# Patient Record
Sex: Female | Born: 1955 | Race: White | Hispanic: No | Marital: Married | Smoking: Former smoker
Health system: Southern US, Community
[De-identification: ages and names within clinical notes are randomized; demographics above are authoritative.]

## PROBLEM LIST (undated history)

## (undated) DIAGNOSIS — R011 Cardiac murmur, unspecified: Secondary | ICD-10-CM

## (undated) DIAGNOSIS — M1990 Unspecified osteoarthritis, unspecified site: Secondary | ICD-10-CM

## (undated) HISTORY — DX: Unspecified osteoarthritis, unspecified site: M19.90

## (undated) HISTORY — PX: TUMOR REMOVAL: SHX12

## (undated) HISTORY — DX: Cardiac murmur, unspecified: R01.1

---

## 1999-06-08 ENCOUNTER — Other Ambulatory Visit: Admission: RE | Admit: 1999-06-08 | Discharge: 1999-06-08 | Payer: Self-pay | Admitting: Obstetrics and Gynecology

## 2000-08-08 ENCOUNTER — Other Ambulatory Visit: Admission: RE | Admit: 2000-08-08 | Discharge: 2000-08-08 | Payer: Self-pay | Admitting: Obstetrics and Gynecology

## 2001-10-09 ENCOUNTER — Other Ambulatory Visit: Admission: RE | Admit: 2001-10-09 | Discharge: 2001-10-09 | Payer: Self-pay | Admitting: Obstetrics and Gynecology

## 2003-06-23 ENCOUNTER — Other Ambulatory Visit: Admission: RE | Admit: 2003-06-23 | Discharge: 2003-06-23 | Payer: Self-pay | Admitting: Obstetrics and Gynecology

## 2004-12-01 ENCOUNTER — Other Ambulatory Visit: Admission: RE | Admit: 2004-12-01 | Discharge: 2004-12-01 | Payer: Self-pay | Admitting: Obstetrics and Gynecology

## 2005-11-14 ENCOUNTER — Encounter: Admission: RE | Admit: 2005-11-14 | Discharge: 2005-11-14 | Payer: Self-pay | Admitting: Family Medicine

## 2007-04-16 IMAGING — CR DG SHOULDER 2+V*R*
3 series · 3 of 3 positions shown · non-contrast
Comparison: None.

CLINICAL DATA: Shoulder pain with decreased range of  motion for three months.  No known injury.
 RIGHT SHOULDER, THREE VIEWS:

[view not recorded (1 of 3)]
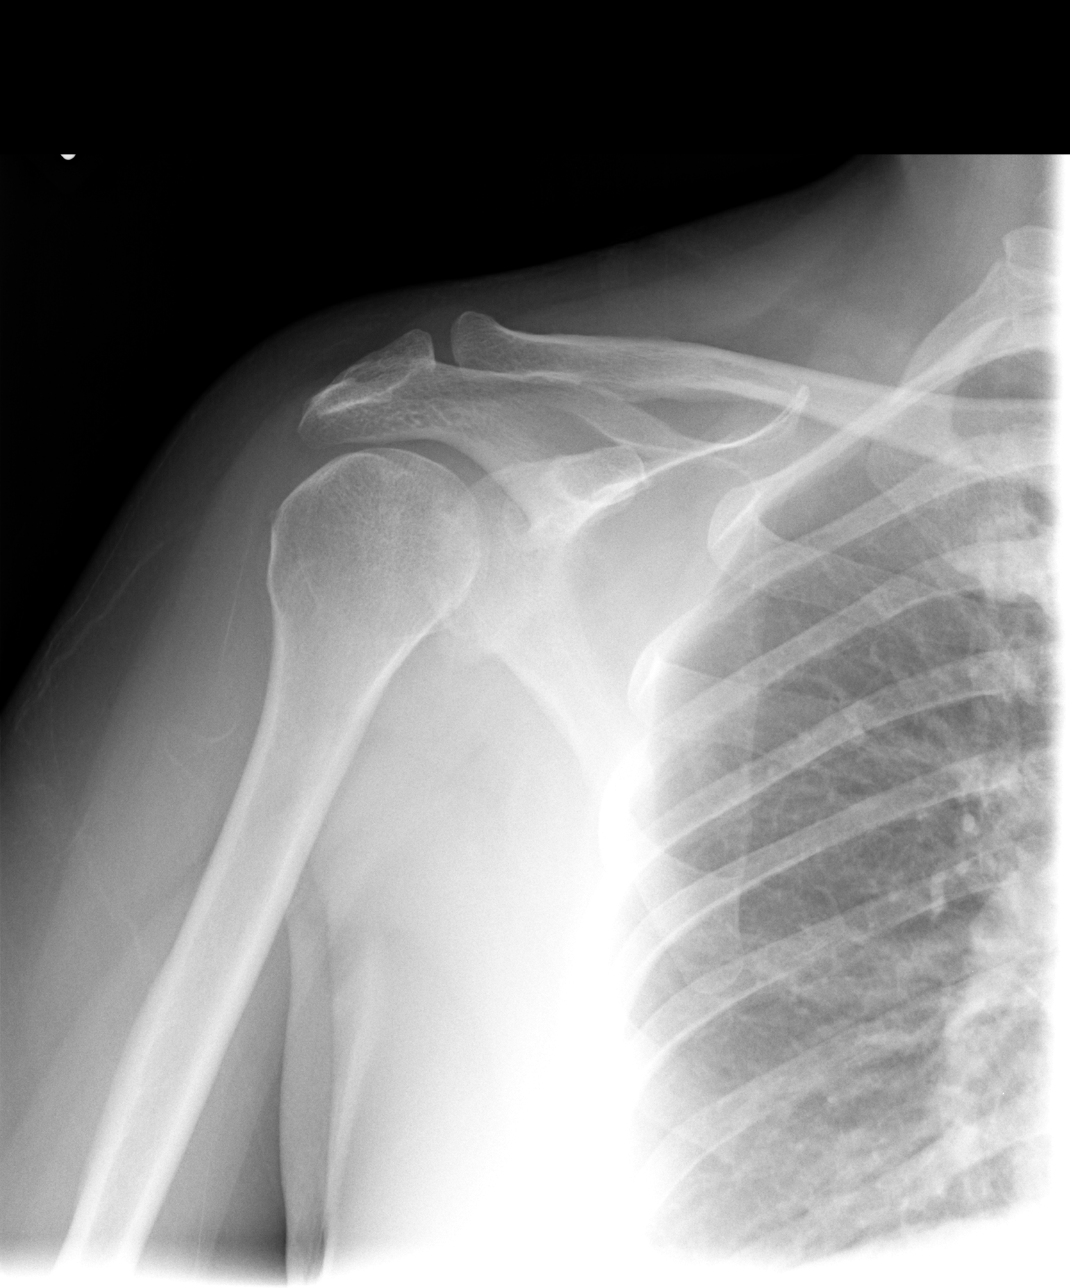

[view not recorded (2 of 3)]
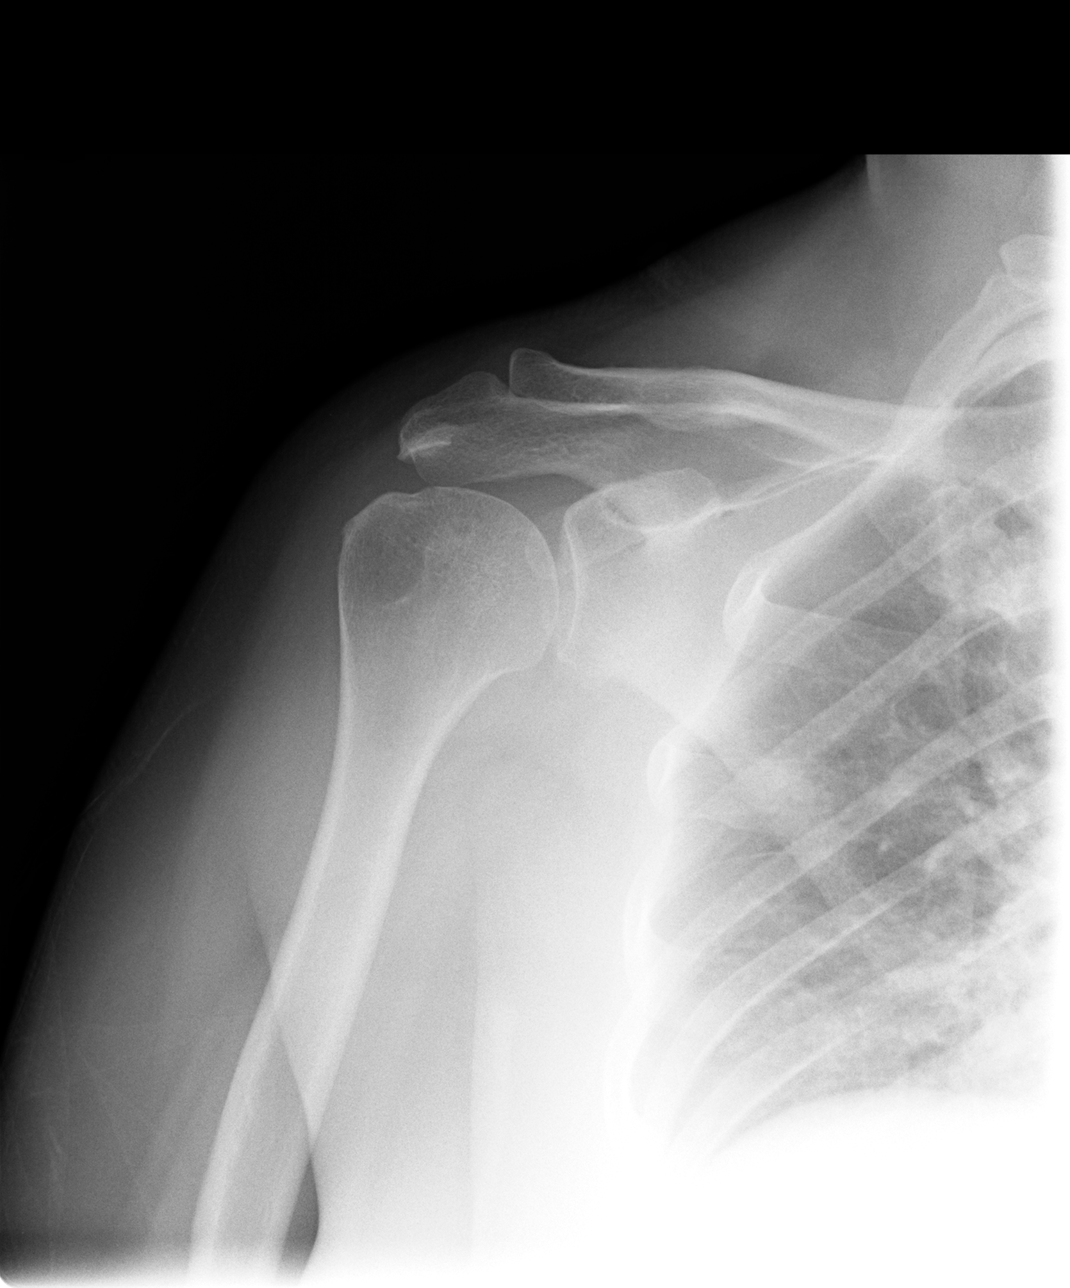

[view not recorded (3 of 3)]
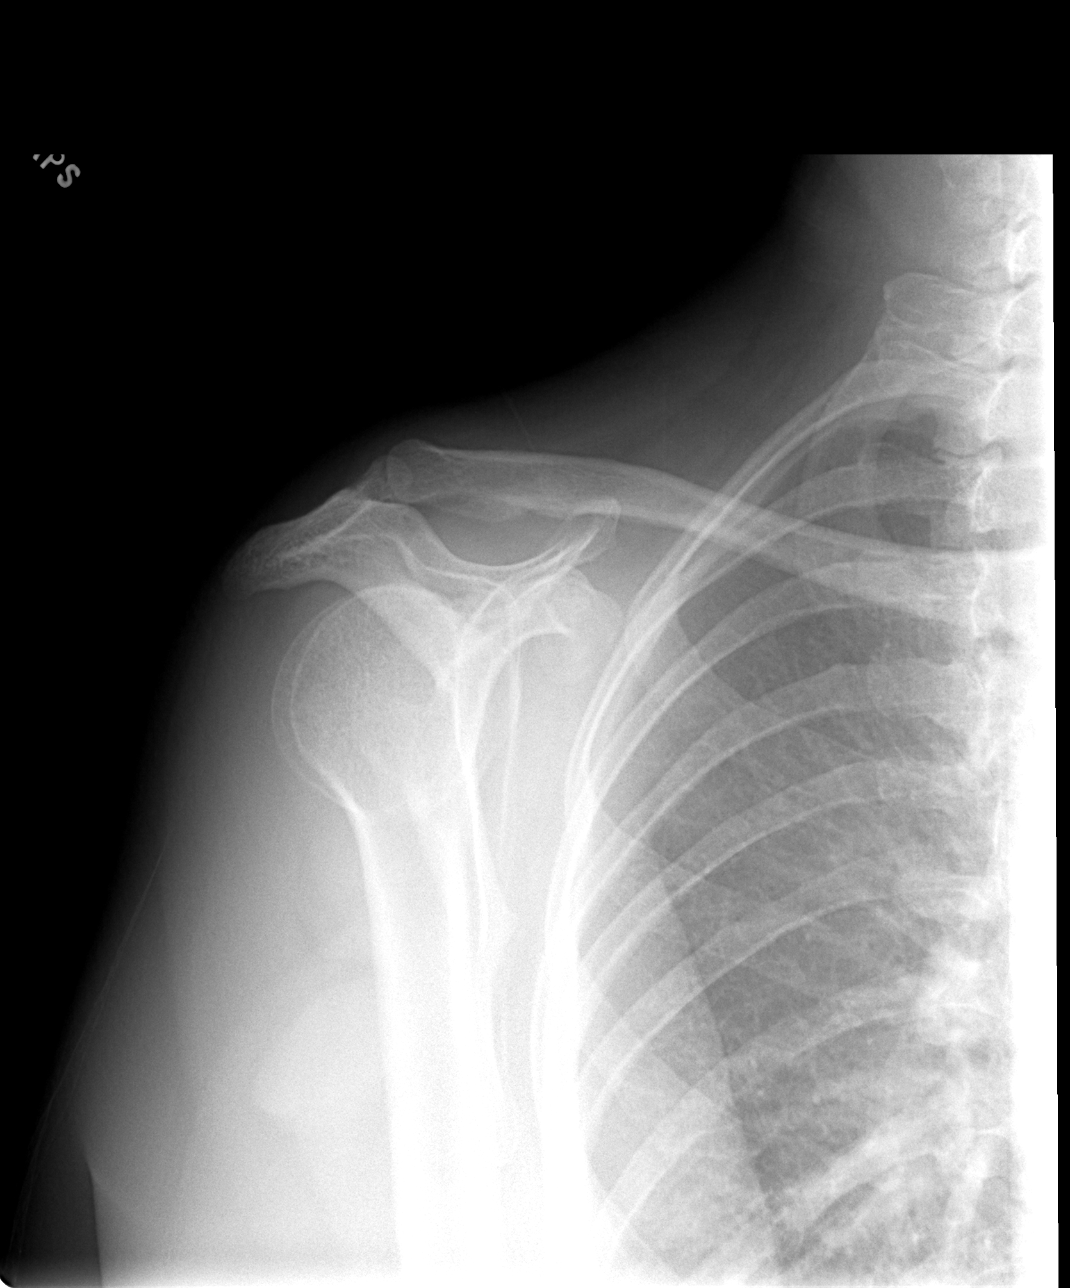

[3 of 3 positions shown; findings below may reference images not displayed]

FINDINGS: The AC joint is within normal limits but there is a prominent spur emanating off the tip of the acromion which could make the patient prone to rotator cuff disease.  Is there clinical evidence for impingement?  If so, consider MRI for further assessment. 
 No acute changes.  No soft tissue calcifications.
IMPRESSION: Degenerative spur emanating off the tip of the acromion ? question impingement.  No acute findings.

## 2008-12-29 ENCOUNTER — Encounter: Payer: Self-pay | Admitting: Cardiology

## 2008-12-29 ENCOUNTER — Ambulatory Visit: Payer: Self-pay | Admitting: Cardiology

## 2008-12-29 DIAGNOSIS — R011 Cardiac murmur, unspecified: Secondary | ICD-10-CM

## 2008-12-29 DIAGNOSIS — J309 Allergic rhinitis, unspecified: Secondary | ICD-10-CM | POA: Insufficient documentation

## 2008-12-29 DIAGNOSIS — M1990 Unspecified osteoarthritis, unspecified site: Secondary | ICD-10-CM | POA: Insufficient documentation

## 2009-01-12 ENCOUNTER — Ambulatory Visit: Payer: Self-pay

## 2009-01-12 ENCOUNTER — Encounter: Payer: Self-pay | Admitting: Cardiology

## 2011-06-19 ENCOUNTER — Encounter: Payer: Self-pay | Admitting: Cardiology

## 2013-03-29 DEATH — deceased

## 2019-12-26 ENCOUNTER — Ambulatory Visit: Payer: Self-pay | Attending: Internal Medicine

## 2019-12-26 DIAGNOSIS — Z23 Encounter for immunization: Secondary | ICD-10-CM | POA: Insufficient documentation

## 2019-12-26 NOTE — Progress Notes (Signed)
   Covid-19 Vaccination Clinic  Name:  Destiny Mclaughlin    MRN: [id number] DOB: 1956/05/10  12/26/2019  Destiny Mclaughlin was observed post Covid-19 immunization for 15 minutes without incidence. She was provided with Vaccine Information Sheet and instruction to access the V-Safe system.   Destiny Mclaughlin was instructed to call 911 with any severe reactions post vaccine: Marland Kitchen Difficulty breathing  . Swelling of your face and throat  . A fast heartbeat  . A bad rash all over your body  . Dizziness and weakness    Immunizations Administered    Name Date Dose VIS Date Route   Pfizer COVID-19 Vaccine 12/26/2019  3:06 PM 0.3 mL 10/09/2019 Intramuscular   Manufacturer: [employer], [employer]   Lot: [id number]   NDC: [address]-8

## 2020-01-16 ENCOUNTER — Ambulatory Visit: Payer: Self-pay | Attending: Internal Medicine

## 2020-01-16 DIAGNOSIS — Z23 Encounter for immunization: Secondary | ICD-10-CM

## 2020-01-16 NOTE — Progress Notes (Signed)
   Covid-19 Vaccination Clinic  Name:  DAAIYAH BAUMERT    MRN: [id number] DOB: November 10, 1955  01/16/2020  Ms. Duerksen was observed post Covid-19 immunization for 15 minutes without incident. She was provided with Vaccine Information Sheet and instruction to access the V-Safe system.   Ms. Scarberry was instructed to call 911 with any severe reactions post vaccine: Marland Kitchen Difficulty breathing  . Swelling of face and throat  . A fast heartbeat  . A bad rash all over body  . Dizziness and weakness   Immunizations Administered    Name Date Dose VIS Date Route   Pfizer COVID-19 Vaccine 01/16/2020  8:22 AM 0.3 mL 10/09/2019 Intramuscular   Manufacturer: [employer], [employer]   Lot: [id number]   NDC: [address]-1

## 2022-10-30 ENCOUNTER — Emergency Department (HOSPITAL_COMMUNITY)
Admission: EM | Admit: 2022-10-30 | Discharge: 2022-10-31 | Disposition: A | Discharge status: Home / Self Care | Payer: BC Managed Care – PPO | Attending: Emergency Medicine | Admitting: Emergency Medicine

## 2022-10-30 ENCOUNTER — Emergency Department (HOSPITAL_COMMUNITY): Payer: BC Managed Care – PPO

## 2022-10-30 ENCOUNTER — Other Ambulatory Visit: Payer: Self-pay

## 2022-10-30 DIAGNOSIS — G454 Transient global amnesia: Secondary | ICD-10-CM | POA: Diagnosis not present

## 2022-10-30 DIAGNOSIS — I10 Essential (primary) hypertension: Secondary | ICD-10-CM | POA: Diagnosis not present

## 2022-10-30 DIAGNOSIS — R410 Disorientation, unspecified: Secondary | ICD-10-CM | POA: Insufficient documentation

## 2022-10-30 DIAGNOSIS — R413 Other amnesia: Secondary | ICD-10-CM | POA: Diagnosis present

## 2022-10-30 DIAGNOSIS — Z1152 Encounter for screening for COVID-19: Secondary | ICD-10-CM | POA: Diagnosis not present

## 2022-10-30 LAB — SALICYLATE LEVEL: Salicylate Lvl: <7 mg/dL — ABNORMAL LOW (ref 7.0–30.0)

## 2022-10-30 LAB — I-STAT CHEM 8, ED
BUN: 23 mg/dL (ref 8–23)
Calcium, Ion: 1.14 mmol/L — ABNORMAL LOW (ref 1.15–1.40)
Chloride: 107 mmol/L (ref 98–111)
Creatinine, Ser: 0.7 mg/dL (ref 0.44–1.00)
Glucose, Bld: 126 mg/dL — ABNORMAL HIGH (ref 70–99)
HCT: 38 % (ref 36.0–46.0)
Hemoglobin: 12.9 g/dL (ref 12.0–15.0)
Potassium: 4.1 mmol/L (ref 3.5–5.1)
Sodium: 141 mmol/L (ref 135–145)
TCO2: 22 mmol/L (ref 22–32)

## 2022-10-30 LAB — DIFFERENTIAL
Abs Immature Granulocytes: 0.04 10*3/uL (ref 0.00–0.07)
Basophils Absolute: 0 10*3/uL (ref 0.0–0.1)
Basophils Relative: 0 %
Eosinophils Absolute: 0 10*3/uL (ref 0.0–0.5)
Eosinophils Relative: 1 %
Immature Granulocytes: 1 %
Lymphocytes Relative: 13 %
Lymphs Abs: 1.1 10*3/uL (ref 0.7–4.0)
Monocytes Absolute: 0.4 10*3/uL (ref 0.1–1.0)
Monocytes Relative: 4 %
Neutro Abs: 6.9 10*3/uL (ref 1.7–7.7)
Neutrophils Relative %: 81 %

## 2022-10-30 LAB — CBC
HCT: 40.6 % (ref 36.0–46.0)
Hemoglobin: 12.8 g/dL (ref 12.0–15.0)
MCH: 28.4 pg (ref 26.0–34.0)
MCHC: 31.5 g/dL (ref 30.0–36.0)
MCV: 90.2 fL (ref 80.0–100.0)
Platelets: 228 10*3/uL (ref 150–400)
RBC: 4.5 MIL/uL (ref 3.87–5.11)
RDW: 13 % (ref 11.5–15.5)
WBC: 8.5 10*3/uL (ref 4.0–10.5)
nRBC: 0 % (ref 0.0–0.2)

## 2022-10-30 LAB — COMPREHENSIVE METABOLIC PANEL
ALT: 20 U/L (ref 0–44)
AST: 19 U/L (ref 15–41)
Albumin: 3.9 g/dL (ref 3.5–5.0)
Alkaline Phosphatase: 54 U/L (ref 38–126)
Anion gap: 12 (ref 5–15)
BUN: 22 mg/dL (ref 8–23)
CO2: 21 mmol/L — ABNORMAL LOW (ref 22–32)
Calcium: 8.9 mg/dL (ref 8.9–10.3)
Chloride: 105 mmol/L (ref 98–111)
Creatinine, Ser: 0.85 mg/dL (ref 0.44–1.00)
GFR, Estimated: >60 mL/min (ref >60)
Glucose, Bld: 129 mg/dL — ABNORMAL HIGH (ref 70–99)
Potassium: 4 mmol/L (ref 3.5–5.1)
Sodium: 138 mmol/L (ref 135–145)
Total Bilirubin: 0.5 mg/dL (ref 0.3–1.2)
Total Protein: 6.8 g/dL (ref 6.5–8.1)

## 2022-10-30 LAB — ACETAMINOPHEN LEVEL: Acetaminophen (Tylenol), Serum: <10 ug/mL — ABNORMAL LOW (ref 10–30)

## 2022-10-30 LAB — PROTIME-INR
INR: 1 (ref 0.8–1.2)
Prothrombin Time: 12.7 seconds (ref 11.4–15.2)

## 2022-10-30 LAB — APTT: aPTT: 32 seconds (ref 24–36)

## 2022-10-30 LAB — ETHANOL: Alcohol, Ethyl (B): <10 mg/dL (ref <10)

## 2022-10-30 NOTE — ED Triage Notes (Signed)
Pt arrives with c/o memory impairment and forgetfulness. Per husband, pts symptoms started late afternoon. Pt denies gait disturbances or focal weakness. Per pt, pt has been asking the same questions over and over.

## 2022-10-30 NOTE — ED Provider Triage Note (Signed)
  Emergency Medicine Provider Triage Evaluation Note  MRN:  [id number]  Arrival date & time: 10/30/22    Medically screening exam initiated at 10:24 PM.   CC:   Memory Loss   HPI:  Destiny Mclaughlin is a 67 y.o. year-old female presents to the ED with chief complaint of memory loss that started this afternoon at lunch time.  LKW 1200 today.  Otherwise normal except for memory.  No numbness or weakness.  History provided by patient. ROS:  -As included in HPI PE:   Vitals:   10/30/22 2041  BP: (!) 144/63  Pulse: 69  Resp: 18  Temp: 99.1 F (37.3 C)  SpO2: 98%    Non-toxic appearing No respiratory distress  MDM:  Based on signs and symptoms, TGA is highest on my differential, followed by CVA. I've ordered imaging and labs in triage to expedite lab/diagnostic workup.  I consulted with Dr. Lorrin Goodell, who recommends MRI.  Patient was informed that the remainder of the evaluation will be completed by another provider, this initial triage assessment does not replace that evaluation, and the importance of remaining in the ED until their evaluation is complete.    Montine Circle, PA-C 10/30/22 2304

## 2022-10-31 ENCOUNTER — Encounter (HOSPITAL_COMMUNITY): Payer: Self-pay

## 2022-10-31 DIAGNOSIS — R413 Other amnesia: Secondary | ICD-10-CM | POA: Diagnosis not present

## 2022-10-31 LAB — URINALYSIS, ROUTINE W REFLEX MICROSCOPIC
Bilirubin Urine: NEGATIVE
Glucose, UA: NEGATIVE mg/dL
Ketones, ur: NEGATIVE mg/dL
Nitrite: NEGATIVE
Protein, ur: NEGATIVE mg/dL
Specific Gravity, Urine: 1.027 (ref 1.005–1.030)
pH: 5 (ref 5.0–8.0)

## 2022-10-31 LAB — RAPID URINE DRUG SCREEN, HOSP PERFORMED
Amphetamines: NOT DETECTED
Barbiturates: NOT DETECTED
Benzodiazepines: NOT DETECTED
Cocaine: NOT DETECTED
Opiates: NOT DETECTED
Tetrahydrocannabinol: NOT DETECTED

## 2022-10-31 LAB — RESP PANEL BY RT-PCR (RSV, FLU A&B, COVID)  RVPGX2
Influenza A by PCR: NEGATIVE
Influenza B by PCR: NEGATIVE
Resp Syncytial Virus by PCR: NEGATIVE
SARS Coronavirus 2 by RT PCR: NEGATIVE

## 2022-10-31 NOTE — ED Notes (Signed)
Provider at bedside; patient and family updated on the plan of care at this time and report understanding

## 2022-10-31 NOTE — ED Provider Notes (Signed)
[facility] EMERGENCY DEPARTMENT Provider Note   CSN: [id number] Arrival date & time: 10/30/22  2034     History  Chief Complaint  Patient presents with   Memory Loss    Destiny Mclaughlin is a 67 y.o. female.  Patient is a 67 year old female past medical history of hypertension presenting to the emergency department with confusion.  Patient is here with her husband he states that he left yesterday around 1:00 in the afternoon and when he came home around 5 PM his wife seemed confused.  She was unable to remember any of the events that they did over Christmas break and was unable to tell him who the president was.  He denied any facial droop, vision changes, speech changes, numbness or weakness.  The patient states that she does not remember the events of the afternoon yesterday but does not believe that she fell and denies any headaches or pain.  She denies any recent fevers or chills, nausea, vomiting or diarrhea, dysuria or hematuria.        Home Medications Prior to Admission medications   Medication Sig Start Date End Date Taking? Authorizing Provider  cetirizine-pseudoephedrine (ZYRTEC-D) 5-120 MG per tablet Take 1 tablet by mouth as needed.      [provider]  ibuprofen (ADVIL,MOTRIN) 200 MG tablet Take 200 mg by mouth as needed.      [provider]      Allergies    Patient has no known allergies.    Review of Systems   Review of Systems  Physical Exam Updated Vital Signs BP (!) 156/80 (BP Location: Right Arm)   Pulse 70   Temp 98.3 F (36.8 C) (Oral)   Resp 16   Ht 5\' 2"  (1.575 m)   Wt 97.5 kg   SpO2 99%   BMI 39.32 kg/m  Physical Exam Vitals and nursing note reviewed.  Constitutional:      General: She is not in acute distress.    Appearance: Normal appearance.  HENT:     Head: Normocephalic and atraumatic.     Nose: Nose normal.     Mouth/Throat:     Mouth: Mucous membranes are moist.     Pharynx: Oropharynx  is clear.  Eyes:     Extraocular Movements: Extraocular movements intact.     Conjunctiva/sclera: Conjunctivae normal.     Pupils: Pupils are equal, round, and reactive to light.  Cardiovascular:     Rate and Rhythm: Normal rate and regular rhythm.     Heart sounds: Normal heart sounds.  Pulmonary:     Effort: Pulmonary effort is normal.     Breath sounds: Normal breath sounds.  Abdominal:     General: Abdomen is flat.     Palpations: Abdomen is soft.     Tenderness: There is no abdominal tenderness. There is no right CVA tenderness or left CVA tenderness.  Musculoskeletal:        General: Normal range of motion.     Cervical back: Normal range of motion and neck supple.  Skin:    General: Skin is warm and dry.  Neurological:     General: No focal deficit present.     Mental Status: She is alert and oriented to person, place, and time.     Cranial Nerves: No cranial nerve deficit.     Sensory: No sensory deficit.     Motor: No weakness.     Comments: Able to identify pen and watch and  give description of their function Oriented to person, place, time and president  Psychiatric:        Mood and Affect: Mood normal.        Behavior: Behavior normal.     ED Results / Procedures / Treatments   Labs (all labs ordered are listed, but only abnormal results are displayed) Labs Reviewed  COMPREHENSIVE METABOLIC PANEL - Abnormal; Notable for the following components:      Result Value   CO2 21 (*)    Glucose, Bld 129 (*)    All other components within normal limits  URINALYSIS, ROUTINE W REFLEX MICROSCOPIC - Abnormal; Notable for the following components:   Hgb urine dipstick SMALL (*)    Leukocytes,Ua SMALL (*)    Bacteria, UA RARE (*)    All other components within normal limits  ACETAMINOPHEN LEVEL - Abnormal; Notable for the following components:   Acetaminophen (Tylenol), Serum <10 (*)    All other components within normal limits  SALICYLATE LEVEL - Abnormal; Notable for  the following components:   Salicylate Lvl <1.6 (*)    All other components within normal limits  I-STAT CHEM 8, ED - Abnormal; Notable for the following components:   Glucose, Bld 126 (*)    Calcium, Ion 1.14 (*)    All other components within normal limits  RESP PANEL BY RT-PCR (RSV, FLU A&B, COVID)  RVPGX2  CBC  PROTIME-INR  ETHANOL  APTT  DIFFERENTIAL  RAPID URINE DRUG SCREEN, HOSP PERFORMED  CBG MONITORING, ED    EKG EKG Interpretation  Date/Time:  Tuesday October 30 2022 22:51:16 EST Ventricular Rate:  69 PR Interval:  166 QRS Duration: 78 QT Interval:  388 QTC Calculation: 415 R Axis:   21 Text Interpretation: Sinus rhythm with sinus arrhythmia with occasional Premature ventricular complexes Possible Anterior infarct , age undetermined Abnormal ECG No previous ECGs available Confirmed by Tretha Sciara [phone]) on 10/30/2022 10:52:48 PM  Radiology MR BRAIN WO CONTRAST  Result Date: 10/31/2022 CLINICAL DATA:  Memory loss.  Forgetfulness. EXAM: MRI HEAD WITHOUT CONTRAST TECHNIQUE: Multiplanar, multiecho pulse sequences of the brain and surrounding structures were obtained without intravenous contrast. COMPARISON:  Head CT 10/30/2022 FINDINGS: Brain: Diffusion imaging does not show any acute or subacute infarction. The brainstem and cerebellum are normal. Cerebral hemispheres show mild age related volume loss without subjective lobar predominance. No evidence of old or recent small or large vessel infarction. No mass lesion, hemorrhage, hydrocephalus or extra-axial collection. Vascular: Major vessels at the base of the brain show flow. Skull and upper cervical spine: Negative Sinuses/Orbits: Clear/normal Other: None IMPRESSION: No acute or reversible finding. Mild age related volume loss without subjective lobar predominance. No evidence of old or recent small or large vessel infarction. Electronically Signed   By: Nelson Chimes M.D.   On: 10/31/2022 16:52   CT HEAD WO CONTRAST  (5MM)  Result Date: 10/30/2022 CLINICAL DATA:  Forgetfulness. EXAM: CT HEAD WITHOUT CONTRAST TECHNIQUE: Contiguous axial images were obtained from the base of the skull through the vertex without intravenous contrast. RADIATION DOSE REDUCTION: This exam was performed according to the departmental dose-optimization program which includes automated exposure control, adjustment of the mA and/or kV according to patient size and/or use of iterative reconstruction technique. COMPARISON:  None Available. FINDINGS: Brain: No evidence of acute infarction, hemorrhage, hydrocephalus, extra-axial collection or mass lesion/mass effect. Vascular: No hyperdense vessel or unexpected calcification. Skull: Normal. Negative for fracture or focal lesion. Sinuses/Orbits: No acute finding. Other: None. IMPRESSION: No  acute intracranial pathology. Electronically Signed   By: Aram Candela M.D.   On: 10/30/2022 22:50    Procedures Procedures    Medications Ordered in ED Medications - No data to display  ED Course/ Medical Decision Making/ A&P Clinical Course as of 10/31/22 2107  Wed Oct 31, 2022  2100 Rare bacteria and small leuks in UA, nitrite negative and without urinary symptoms UTI unlikely. Patient is stable for discharge with outpatient neurology follow up. [VK]    Clinical Course User Index [VK] Rexford Maus, DO                           Medical Decision Making This patient presents to the ED with chief complaint(s) of confusion with pertinent past medical history of HTN which further complicates the presenting complaint. The complaint involves an extensive differential diagnosis and also carries with it a high risk of complications and morbidity.    The differential diagnosis includes ICH, mass effect, CVA, electrolyte abnormality, infection  Additional history obtained: Additional history obtained from spouse Records reviewed N/A  ED Course and Reassessment: Patient was initially evaluated  by provider in triage who had EKG, labs and head CT performed.  Neurology had recommended MRI.  The patient's workup thus far is negative including a negative MRI and CT head, labs are within normal range.  Urinalysis is pending at this time.  The patient appears to be back to her neurologic baseline.  Patient will likely be stable for discharge home pending UA to determine need for antibiotics with follow-up with primary care versus neurology.  Independent labs interpretation:  The following labs were independently interpreted: within normal range  Independent visualization of imaging: - I independently visualized the following imaging with scope of interpretation limited to determining acute life threatening conditions related to emergency care: CTH, MRI brain, which revealed no acute disease   Consultation: - Consulted or discussed management/test interpretation w/ external professional: N/A  Consideration for admission or further workup: Patient has no emergent conditions requiring admission or further work-up at this time and is stable for discharge home with neurology follow-up  Social Determinants of health: N/A            Final Clinical Impression(s) / ED Diagnoses Final diagnoses:  Transient amnesia    Rx / DC Orders ED Discharge Orders          Ordered    Ambulatory referral to Neurology       Comments: An appointment is requested in approximately: 2 weeks   10/31/22 2105              Rexford Maus, DO 10/31/22 2107

## 2022-10-31 NOTE — Discharge Instructions (Signed)
You were seen in the emergency department for your episode of confusion and memory loss.  Your symptoms had resolved throughout your ED visit and your workup showed no obvious signs of stroke, infection, dehydration or abnormal electrolytes to explain your symptoms.  It is unclear exactly what caused the symptoms today, but you can follow-up with neurology as an outpatient to have your symptoms reassessed and to determine if you need further workup.  You should return to the emergency department if you are having worsening episodes of confusion especially if it does not go away or is associated with facial weakness, numbness or weakness on one side of the body compared to the other, difficulty speaking, fevers, severe headache or if you have any other new or concerning symptoms.

## 2022-11-01 NOTE — Progress Notes (Addendum)
Assessment/Plan:    Transient Alteration of Awareness, suggestive of Transient Global Amnesia  Destiny Mclaughlin is a 67 y.o. F with a history of hypertension admitted to the ED with increased confusion, unable to recall the events over Christmas, or even remembering the president's name, resolved within 8 hours. Workup labs were negative. MRI personally reviewed was negative for acute findings, remarkable for mild age related volume loss, no stroke or masses. EKG and 2 D echo were unremarkable.  MMSE was 28/30   Recommendations Check EEG for transient alteration of awareness  Destiny Mclaughlin 11/03/2022 12:39 PM   Subjective:   Destiny Mclaughlin was seen today in T at the request of Destiny Durie, DO.  The consultation is for the evaluation of  transient alteration of awareness. She is accompanied by her husband who supplements the history    Specific Symptoms:   R or L handed? Right handed    Memory changes:  denies prior to that event. She knew self. Able to recognize husband. Able to move extremities, no signs of stroke. She was understanding words well, husband says Witnessed? "My husband  left in the afternoon and  I was off. He says that when he returned to the house I was asking the same questions, and was confused". He took her to the ED. She was still confused. " She knew who I was however, but could not tell the name of the president or what happened at the [place]".  Her husband says that she" may have gone to the outside porch hot tub (102 F), because the bathing suit was wet" She used chlorine, not [employer] Salts. When asked about any strenuous activity, she denied, although admits having had sexual intercourse prior to this event.  It eventually resolved w 8 h. Of note, she reports the prior week she had been busy due to the [place], and it involves " a good kind of stress".  Disoriented when walking into a room?  Patient denies   Leaving objects in unusual places?   Patient denies   Wandering behavior?   denies   Any personality changes since last visit?   denies   Any worsening depression?: denies   Any hygiene concerns?   denies   Independent of bathing and dressing?  Endorsed  Does the patient needs help with medications? Patient  is in charge  Who is in charge of the finances?   Husband  is in charge    Any changes in appetite? No new diet products, was doing keto, but since Thanksgiving we have not been on it" I take coconut oil. High protein diet"  Patient have trouble swallowing?  denies   Does the patient cook? No   Any kitchen accidents such as leaving the stove on?   denies   Any headaches?    denies history of migraines Vision changes? denies Chronic back pain  denies   Ambulates with difficulty?    denies   Recent falls or head injuries?    denies     Unilateral weakness, numbness or tingling?   denies   Tremor: " She does not think so".  Voice: No Hallucinations: no   visual distortions no             Auditory hallucinations? No   Taste of blood or metallic taste? No  Nausea and Vomiting? No  Lightheadedness/ Dizziness? No  Syncope? No Any anosmia?    denies   Any incontinence of urine?  denies   Any bowel dysfunction?  denies    Diplopia or other visual changes? No  History of encephalitis or meningitis? No  History of Headaches? No  Head Trauma/Injuries?no  Loss of smell:  no  Loss of taste :  no  Urinary or Bowel Incontinence : no  Difficulty Swallowing ? no  Trouble with ADL's?  Trouble buttoning clothing? no   Mood Changes? Denies depression.   Sleep:  How many hours? 6-7 h               Rested upon waking up?  Not during the week  ETOH? Wine occasionally  Tobacco? No  Recreational Drugs? No  Caffeine? Trying to cut down, 2 cups a day, no soda  Patient lives  with husband Does the patient drive?  No issues   Neuroimaging of the brain has previously reviewed was negative for acute findings, minimal chronic  microvascular disease, minimal volume loss, no atrophy        11/03/2022   12:00 PM  Montreal Cognitive Assessment   Visuospatial/ Executive (0/5) 5  Naming (0/3) 3  Attention: Read list of digits (0/2) 2  Attention: Read list of letters (0/1) 1  Attention: Serial 7 subtraction starting at 100 (0/3) 3  Language: Repeat phrase (0/2) 2  Language : Fluency (0/1) 1  Abstraction (0/2) 2  Delayed Recall (0/5) 3  Orientation (0/6) 6  Total 28  Adjusted Score (based on education) 28     PREVIOUS MEDICATIONS:   ALLERGIES:  No Known Allergies  CURRENT MEDICATIONS:  Current Outpatient Medications  Medication Instructions   cetirizine (ZYRTEC) 10 MG chewable tablet ZyrTEC   cetirizine-pseudoephedrine (ZYRTEC-D) 5-120 MG per tablet 1 tablet, As needed   ibuprofen (ADVIL) 200 mg, As needed   losartan (COZAAR) 25 MG tablet 1 tablet, Oral, Daily   Multiple Vitamins-Minerals (MULTIVITAMIN ADULT EXTRA C PO) Multivitamin    Objective:   VITALS:   Vitals:   11/02/22 1256  BP: 120/75  Pulse: 65  Resp: 20  SpO2: 97%  Weight: 213 lb (96.6 kg)  Height: 5\' 5"  (1.651 m)    GEN:  The patient appears stated age and is in NAD. HEENT:  Normocephalic, atraumatic.  The mucous membranes are moist. The superficial temporal arteries are without ropiness or tenderness. CV:  RRR Lungs:  CTAB Neck/HEME:  There are no carotid bruits bilaterally.  Neurological examination: General: No acute distress Orientation: The patient is alert and oriented x3.  Cranial nerves: There is good facial symmetry. Extraocular muscles are intact. The visual fields are full to confrontational testing. No nystagmus. No ptosis.The speech is fluent and clear. Soft palate rises symmetrically and there is no tongue deviation. Hearing is intact to conversational tone. Sensation: Sensation is intact to light and pinprick throughout (facial, trunk, extremities). Vibration is intact at the bilateral big toe. There is no  extinction with double simultaneous stimulation. There is no sensory dermatomal level identified. Motor: Strength is 5/5 in the bilateral upper and lower extremities. Shoulder shrug is equal and symmetric.  There is no pronator drift. Deep tendon reflexes: Deep tendon reflexes are 2/4 at the bilateral biceps, triceps, brachioradialis, patella and achilles. Plantar responses are downgoing bilaterally. Skin: No rash, no edema, no suspicious lesions  Movement examination: Tone: There is normal tone in the bilateral upper extremities.  The tone in the lower extremities is normal.  Abnormal movements: none Coordination:  There is no decremation with RAM's, normal FNF Gait and Station: The patient has  no  difficulty arising out of a deep-seated chair without the use of the hands. The patient's stride length is normal.  The patient has a normal pull test.    Tremor:  none      Total time spent on today's visit was  60 minutes, including both face-to-face time and nonface-to-face time.  Time included that spent on review of records (prior notes available to me/labs/imaging if pertinent), discussing treatment and goals, answering patient's questions and coordinating care.  Cc:  Pcp, No

## 2022-11-02 ENCOUNTER — Ambulatory Visit (INDEPENDENT_AMBULATORY_CARE_PROVIDER_SITE_OTHER): Payer: BC Managed Care – PPO | Admitting: Physician Assistant

## 2022-11-02 ENCOUNTER — Encounter: Payer: Self-pay | Admitting: Physician Assistant

## 2022-11-02 VITALS — BP 120/75 | HR 65 | Resp 20 | Ht 65.0 in | Wt 213.0 lb

## 2022-11-02 DIAGNOSIS — R404 Transient alteration of awareness: Secondary | ICD-10-CM | POA: Diagnosis not present

## 2022-11-02 NOTE — Patient Instructions (Signed)
EEG Follow in 2 week

## 2022-11-05 ENCOUNTER — Ambulatory Visit (INDEPENDENT_AMBULATORY_CARE_PROVIDER_SITE_OTHER): Payer: BC Managed Care – PPO | Admitting: Neurology

## 2022-11-05 DIAGNOSIS — R404 Transient alteration of awareness: Secondary | ICD-10-CM | POA: Diagnosis not present

## 2022-11-05 NOTE — Progress Notes (Signed)
EEG complete - results pending 

## 2022-11-13 NOTE — Progress Notes (Signed)
EEG was normal, no epilepsy was noted in the 1 h test, thanks

## 2022-11-13 NOTE — Procedures (Signed)
ELECTROENCEPHALOGRAM REPORT  Date of Study: 11/05/2022  Patient's Name: DAWNETTA COPENHAVER MRN: [id number] Date of Birth: Mar 26, 1956  Referring Provider: Sharene Butters, PA-C  Clinical History: This is a 67 year old woman with transient confusion. EEG for classification.  Medications: Losartan, Zyrtec  Technical Summary: A multichannel digital 1-hour EEG recording measured by the international 10-20 system with electrodes applied with paste and impedances below 5000 ohms performed in our laboratory with EKG monitoring in an awake and asleep patient.  Hyperventilation and photic stimulation were performed.  The digital EEG was referentially recorded, reformatted, and digitally filtered in a variety of bipolar and referential montages for optimal display.    Description: The patient is awake and asleep during the recording.  During maximal wakefulness, there is a symmetric, medium voltage 10 Hz posterior dominant rhythm that attenuates with eye opening.  The record is symmetric.  During drowsiness and sleep, there is an increase in theta slowing of the background.  Vertex waves and symmetric sleep spindles were seen. Hyperventilation and photic stimulation did not elicit any abnormalities.  There were no epileptiform discharges or electrographic seizures seen.    EKG lead showed sinus bradycardia at 48 bpm.  Impression: This 1-hour awake and asleep EEG is normal.    Clinical Correlation: A normal EEG does not exclude a clinical diagnosis of epilepsy.  If further clinical questions remain, prolonged EEG may be helpful.  Clinical correlation is advised.   Ellouise Newer, M.D.

## 2022-11-20 ENCOUNTER — Ambulatory Visit: Payer: Medicare Other | Admitting: Physician Assistant

## 2022-12-19 ENCOUNTER — Ambulatory Visit (INDEPENDENT_AMBULATORY_CARE_PROVIDER_SITE_OTHER): Payer: BC Managed Care – PPO | Admitting: Physician Assistant

## 2022-12-19 ENCOUNTER — Encounter: Payer: Self-pay | Admitting: Physician Assistant

## 2022-12-19 VITALS — Ht 65.0 in

## 2022-12-19 DIAGNOSIS — G454 Transient global amnesia: Secondary | ICD-10-CM | POA: Diagnosis not present

## 2022-12-19 NOTE — Progress Notes (Signed)
Assessment/Plan:   Transient alteration of awareness, suspicious for transient global amnesia  Destiny Mclaughlin is a very pleasant 67 y.o. RH female with a history of hypertension, sinus bradycardia, seen today in follow up after an episode of transient alteration of awareness after Christmas, with workup essentially negative.  Personally reviewed MRI of the brain was negative for acute findings, remarkable for mild age-related volume loss without stroke or masses.  EKG and 2D echo were unremarkable.  1 hour EEG was negative for seizures.  MoCA was 28/30. She reports no further or similar events. 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  Discussed the results with patient and all the questions were addressed     Follow up as needed Recommend good control of cardiovascular risk factors.   Instructed to call EMS if another similar episode occurs Monitor driving.      Subjective:    This patient is here alone Previous records as well as any outside records available were reviewed prior to todays visit.    Any changes in memory since last visit? Denies     Initial Visit 11/02/22 Destiny Mclaughlin was seen today in T at the request of Kemper Durie, DO.  The consultation is for the evaluation of  transient alteration of awareness. She is accompanied by her husband who supplements the history    Specific Symptoms:    R or L handed? Right handed    Memory changes:  denies prior to that event. She knew self. Able to recognize husband. Able to move extremities, no signs of stroke. She was understanding words well, husband says Witnessed? "My husband  left in the afternoon and  I was off. He says that when he returned to the house I was asking the same questions, and was confused". He took her to the ED. She was still confused. " She knew who I was however, but could not tell the name of the president or what happened at the [place]".  Her husband says that she"  may have gone to the outside porch hot tub (102 F), because the bathing suit was wet" She used chlorine, not [employer] Sal[employer]. When asked about any strenuous activity, she denied, although admits having had sexual intercourse prior to this event.  It eventually resolved w 8 h. Of note, she reports the prior week she had been busy due to the [place], and it involves " a good kind of stress".  Disoriented when walking into a room?  Patient denies   Leaving objects in unusual places?  Patient denies   Wandering behavior?   denies   Any personality changes since last visit?   denies   Any worsening depression?: denies   Any hygiene concerns?   denies   Independent of bathing and dressing?  Endorsed  Does the patient needs help with medications? Patient  is in charge  Who is in charge of the finances?   Husband  is in charge    Any changes in appetite? No new diet products, was doing keto, but since Thanksgiving we have not been on it" I take coconut oil. High protein diet"  Patient have trouble swallowing?  denies   Does the patient cook? No   Any kitchen accidents such as leaving the stove on?   denies   Any headaches?    denies history of migraines Vision changes? denies Chronic back pain  denies   Ambulates with difficulty?    denies  Recent falls or head injuries?    denies     Unilateral weakness, numbness or tingling?   denies   Tremor: " She does not think so".  Voice: No Hallucinations: no              visual distortions no             Auditory hallucinations? No   Taste of blood or metallic taste? No  Nausea and Vomiting? No  Lightheadedness/ Dizziness? No             Syncope? No Any anosmia?    denies   Any incontinence of urine?  denies   Any bowel dysfunction?  denies    Diplopia or other visual changes? No  History of encephalitis or meningitis? No  History of Headaches? No  Head Trauma/Injuries?no  Loss of smell:  no  Loss of taste :  no  Urinary or Bowel Incontinence : no   Difficulty Swallowing ? no  Trouble with ADL's?             Trouble buttoning clothing? no   Mood Changes? Denies depression.   Sleep:  How many hours? 6-7 h               Rested upon waking up?  Not during the week  ETOH? Wine occasionally  Tobacco? No  Recreational Drugs? No  Caffeine? Trying to cut down, 2 cups a day, no soda  Patient lives  with husband Does the patient drive?  No issues    Neuroimaging of the brain has previously reviewed was negative for acute findings, minimal chronic microvascular disease, minimal volume loss, no atrophy  CURRENT MEDICATIONS:  Outpatient Encounter Medications as of 12/19/2022  Medication Sig   cetirizine (ZYRTEC) 10 MG chewable tablet ZyrTEC   cetirizine-pseudoephedrine (ZYRTEC-D) 5-120 MG per tablet Take 1 tablet by mouth as needed.   (Patient not taking: Reported on 11/02/2022)   ibuprofen (ADVIL,MOTRIN) 200 MG tablet Take 200 mg by mouth as needed.   (Patient not taking: Reported on 11/02/2022)   losartan (COZAAR) 25 MG tablet Take 1 tablet by mouth daily.   Multiple Vitamins-Minerals (MULTIVITAMIN ADULT EXTRA C PO) Multivitamin   No facility-administered encounter medications on file as of 12/19/2022.        No data to display            11/03/2022   12:00 PM  Montreal Cognitive Assessment   Visuospatial/ Executive (0/5) 5  Naming (0/3) 3  Attention: Read list of digits (0/2) 2  Attention: Read list of letters (0/1) 1  Attention: Serial 7 subtraction starting at 100 (0/3) 3  Language: Repeat phrase (0/2) 2  Language : Fluency (0/1) 1  Abstraction (0/2) 2  Delayed Recall (0/5) 3  Orientation (0/6) 6  Total 28  Adjusted Score (based on education) 28   Thank you for allowing [place] the opportunity to participate in the care of this nice patient. Please do not hesitate to contact us for any questions or concerns.   Total time spent on today's visit was 18 minutes dedicated to this patient today, preparing to see patient, examining  the patient, ordering tests and/or medications and counseling the patient, documenting clinical information in the EHR or other health record, independently interpreting results and communicating results to the patient/family, discussing treatment and goals, answering patient's questions and coordinating care.  Cc:  [facility], [place] 12/19/2022 7:34 AM

## 2024-06-04 ENCOUNTER — Other Ambulatory Visit: Payer: Self-pay | Admitting: Obstetrics and Gynecology

## 2024-06-04 DIAGNOSIS — R928 Other abnormal and inconclusive findings on diagnostic imaging of breast: Secondary | ICD-10-CM

## 2024-06-11 ENCOUNTER — Ambulatory Visit
Admission: RE | Admit: 2024-06-11 | Discharge: 2024-06-11 | Disposition: A | Discharge status: Home / Self Care | Source: Ambulatory Visit | Attending: Obstetrics and Gynecology | Admitting: Obstetrics and Gynecology

## 2024-06-11 DIAGNOSIS — R928 Other abnormal and inconclusive findings on diagnostic imaging of breast: Secondary | ICD-10-CM
